# Patient Record
Sex: Male | Born: 1985 | Race: White | Hispanic: No | Marital: Married | Smoking: Never smoker
Health system: Southern US, Community
[De-identification: ages and names within clinical notes are randomized; demographics above are authoritative.]

---

## 2004-04-13 ENCOUNTER — Inpatient Hospital Stay (HOSPITAL_COMMUNITY)
Admission: AD | Admit: 2004-04-13 | Discharge: 2004-04-17 | Payer: Self-pay | Admitting: Physical Medicine & Rehabilitation

## 2004-04-13 ENCOUNTER — Ambulatory Visit: Payer: Self-pay | Admitting: Physical Medicine & Rehabilitation

## 2004-05-06 ENCOUNTER — Encounter (HOSPITAL_COMMUNITY): Admission: RE | Admit: 2004-05-06 | Discharge: 2004-06-05 | Payer: Self-pay

## 2004-06-08 ENCOUNTER — Encounter (HOSPITAL_COMMUNITY): Admission: RE | Admit: 2004-06-08 | Discharge: 2004-07-08 | Payer: Self-pay | Admitting: Orthopedic Surgery

## 2004-07-10 ENCOUNTER — Encounter (HOSPITAL_COMMUNITY): Admission: RE | Admit: 2004-07-10 | Discharge: 2004-08-09 | Payer: Self-pay | Admitting: Orthopedic Surgery

## 2004-08-10 ENCOUNTER — Encounter (HOSPITAL_COMMUNITY): Admission: RE | Admit: 2004-08-10 | Discharge: 2004-09-09 | Payer: Self-pay | Admitting: Orthopedic Surgery

## 2004-09-14 ENCOUNTER — Encounter (HOSPITAL_COMMUNITY): Admission: RE | Admit: 2004-09-14 | Discharge: 2004-10-14 | Payer: Self-pay | Admitting: Orthopedic Surgery

## 2004-10-16 ENCOUNTER — Encounter (HOSPITAL_COMMUNITY): Admission: RE | Admit: 2004-10-16 | Discharge: 2004-10-31 | Payer: Self-pay | Admitting: Orthopedic Surgery

## 2004-11-02 ENCOUNTER — Encounter (HOSPITAL_COMMUNITY): Admission: RE | Admit: 2004-11-02 | Discharge: 2004-12-02 | Payer: Self-pay | Admitting: Orthopedic Surgery

## 2004-12-04 ENCOUNTER — Encounter (HOSPITAL_COMMUNITY): Admission: RE | Admit: 2004-12-04 | Discharge: 2005-01-03 | Payer: Self-pay | Admitting: Orthopedic Surgery

## 2005-01-04 ENCOUNTER — Encounter (HOSPITAL_COMMUNITY): Admission: RE | Admit: 2005-01-04 | Discharge: 2005-01-15 | Payer: Self-pay | Admitting: Orthopedic Surgery

## 2007-04-25 ENCOUNTER — Ambulatory Visit (HOSPITAL_COMMUNITY): Admission: RE | Admit: 2007-04-25 | Discharge: 2007-04-26 | Payer: Self-pay | Admitting: Family Medicine

## 2007-04-28 ENCOUNTER — Ambulatory Visit: Payer: Self-pay | Admitting: Cardiology
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
## 2014-04-17 ENCOUNTER — Telehealth: Payer: Self-pay | Admitting: Family Medicine
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
## 2014-04-18 NOTE — Telephone Encounter (Signed)
Christian Ramirez I am not currently taking new pts unless a family member of a current see next  ( is this an immediate member of a family we are seeing? Dad of a child? Etc ) Dr Margo AyeHall [place] [address] or [place] other choices

## 2014-04-18 NOTE — Telephone Encounter (Signed)
This will be a new patient

## 2014-04-22 NOTE — Telephone Encounter (Signed)
We would be happy to fill out this form. Please have him drop it by. Also at his age we do recommend a general health checkup at least every 2-3 years so it sounds to me like he is probably due for this spring

## 2014-04-22 NOTE — Telephone Encounter (Signed)
pts spouse is a patient here, Vonzell Schlatterawn Garfinkel

## 2014-04-23 NOTE — Telephone Encounter (Signed)
Pt notified on VM. 

## 2014-04-25 ENCOUNTER — Telehealth: Payer: Self-pay | Admitting: Family Medicine

## 2014-04-25 NOTE — Telephone Encounter (Signed)
Pt's wife dropped off a form to be filled out for a handicap placard. Pt's wife Stated that the pt has never been seen before but said that he spoke with a nurse last Week regarding this form and the nurse said the Dr. Eulah CitizenWould still fill the form out with out the Pt being seen. Pt's wife is a pt here.

## 2014-04-28 NOTE — Telephone Encounter (Signed)
Completed. Patient is advised to follow-up at least every 2 years for wellness

## 2014-04-29 ENCOUNTER — Telehealth: Payer: Self-pay | Admitting: *Deleted

## 2014-04-29 NOTE — Telephone Encounter (Signed)
[facility] to notify pt that form is ready for pickup.

## 2014-12-18 ENCOUNTER — Emergency Department (HOSPITAL_COMMUNITY)
Admission: EM | Admit: 2014-12-18 | Discharge: 2014-12-18 | Disposition: A | Discharge status: Home / Self Care | Payer: Managed Care, Other (non HMO) | Attending: Emergency Medicine | Admitting: Emergency Medicine

## 2014-12-18 ENCOUNTER — Emergency Department (HOSPITAL_COMMUNITY): Payer: Managed Care, Other (non HMO)

## 2014-12-18 ENCOUNTER — Encounter (HOSPITAL_COMMUNITY): Payer: Self-pay | Admitting: Emergency Medicine

## 2014-12-18 DIAGNOSIS — Y998 Other external cause status: Secondary | ICD-10-CM | POA: Insufficient documentation

## 2014-12-18 DIAGNOSIS — Y9389 Activity, other specified: Secondary | ICD-10-CM | POA: Insufficient documentation

## 2014-12-18 DIAGNOSIS — R0789 Other chest pain: Secondary | ICD-10-CM

## 2014-12-18 DIAGNOSIS — S29001A Unspecified injury of muscle and tendon of front wall of thorax, initial encounter: Secondary | ICD-10-CM | POA: Insufficient documentation

## 2014-12-18 DIAGNOSIS — S299XXA Unspecified injury of thorax, initial encounter: Secondary | ICD-10-CM | POA: Diagnosis present

## 2014-12-18 DIAGNOSIS — Y92410 Unspecified street and highway as the place of occurrence of the external cause: Secondary | ICD-10-CM | POA: Insufficient documentation

## 2014-12-18 MED ORDER — IBUPROFEN 600 MG PO TABS: 600.0000 mg | ORAL_TABLET | Freq: Three times a day (TID) | ORAL | Status: DC | PRN

## 2014-12-18 MED ORDER — IBUPROFEN 400 MG PO TABS
600.0000 mg | ORAL_TABLET | Freq: Once | ORAL | Status: AC
Start: 2014-12-18 — End: 2014-12-18
  Administered 2014-12-18: 600 mg via ORAL
  Filled 2014-12-18: qty 2
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
## 2014-12-18 NOTE — Discharge Instructions (Signed)

## 2014-12-18 NOTE — ED Notes (Signed)
Pt made aware to return if symptoms worsen or if any life threatening symptoms occur.   
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
## 2014-12-18 NOTE — ED Notes (Signed)
Dr. Campos at bedside   

## 2014-12-20 ENCOUNTER — Telehealth: Payer: Self-pay | Admitting: Family Medicine

## 2014-12-20 ENCOUNTER — Encounter: Payer: Self-pay | Admitting: Family Medicine
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
## 2014-12-20 NOTE — Telephone Encounter (Signed)
Tuesday or Friday am or with Eber Jonesarolyn, work excuse next week

## 2014-12-20 NOTE — Telephone Encounter (Signed)
Appt made for 11/25 and W/E done

## 2014-12-27 ENCOUNTER — Ambulatory Visit (INDEPENDENT_AMBULATORY_CARE_PROVIDER_SITE_OTHER): Payer: Self-pay | Admitting: Family Medicine

## 2014-12-27 ENCOUNTER — Encounter: Payer: Self-pay | Admitting: Family Medicine

## 2014-12-27 VITALS — BP 122/84 | Ht 76.0 in | Wt 163.2 lb

## 2014-12-27 DIAGNOSIS — S20211D Contusion of right front wall of thorax, subsequent encounter: Secondary | ICD-10-CM

## 2014-12-27 MED ORDER — MELOXICAM 15 MG PO TABS: 15.0000 mg | ORAL_TABLET | Freq: Every day | ORAL | Status: DC

## 2014-12-27 MED ORDER — HYDROCODONE-ACETAMINOPHEN 7.5-325 MG PO TABS: 1.0000 | ORAL_TABLET | Freq: Four times a day (QID) | ORAL | Status: DC | PRN
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
## 2015-02-12 ENCOUNTER — Telehealth: Payer: Self-pay | Admitting: Family Medicine
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
## 2015-02-13 NOTE — Telephone Encounter (Signed)
This was approved-form was signed and picked up by Alcario DroughtErica

## 2015-12-29 ENCOUNTER — Encounter: Payer: Self-pay | Admitting: Family Medicine

## 2015-12-29 ENCOUNTER — Ambulatory Visit (INDEPENDENT_AMBULATORY_CARE_PROVIDER_SITE_OTHER): Payer: Managed Care, Other (non HMO) | Admitting: Family Medicine

## 2015-12-29 VITALS — BP 118/78 | Ht 76.0 in | Wt 167.8 lb

## 2015-12-29 DIAGNOSIS — M79605 Pain in left leg: Secondary | ICD-10-CM

## 2015-12-29 DIAGNOSIS — G546 Phantom limb syndrome with pain: Secondary | ICD-10-CM | POA: Insufficient documentation

## 2015-12-29 DIAGNOSIS — H9202 Otalgia, left ear: Secondary | ICD-10-CM

## 2015-12-29 MED ORDER — HYDROCODONE-ACETAMINOPHEN 7.5-325 MG PO TABS: 1.0000 | ORAL_TABLET | Freq: Three times a day (TID) | ORAL | 0 refills | Status: DC | PRN

## 2015-12-29 NOTE — Progress Notes (Signed)
   Subjective:    Patient ID: Christian Ramirez, male    DOB: April 13, 1985, 30 y.o.   MRN: [id number]  HPI Patient states he needs a refill of hydrocodone for occasional phantom pain. Patient states his last prescription was a year ago and has expired and would like to have some on hand that are in date.   Patient also with c/o left ear pain off and on for few weeks-worse in the cold air. Patient relates a low bit of a head cold but that has gone away. Denies wheezing or difficulty breathing  Review of Systems    no fever vomiting diarrhea. Objective:   Physical Exam Lungs clear heart regular patient has artificial limb on the left leg above-the-knee amputation ears are normal       Assessment & Plan:  Otalgia-no sign of infection if ongoing troubles notify us none currently  Phantom pains from previous amputation prescription for hydrocodone given 30 tablets, he states last year this last memo whole year. His use is within the next SPECT and range and not abusive  Wellness exam and screening labs recommended

## 2016-01-16 ENCOUNTER — Ambulatory Visit: Payer: Self-pay | Admitting: Family Medicine
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
## 2017-01-03 ENCOUNTER — Ambulatory Visit: Payer: BC Managed Care – PPO | Admitting: Sports Medicine

## 2017-01-03 ENCOUNTER — Encounter: Payer: Self-pay | Admitting: Sports Medicine

## 2017-01-03 DIAGNOSIS — G546 Phantom limb syndrome with pain: Secondary | ICD-10-CM | POA: Diagnosis not present

## 2017-01-03 DIAGNOSIS — Z89612 Acquired absence of left leg above knee: Secondary | ICD-10-CM

## 2017-01-03 MED ORDER — GABAPENTIN 300 MG PO CAPS: 300.0000 mg | ORAL_CAPSULE | Freq: Three times a day (TID) | ORAL | 3 refills | Status: DC | PRN
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
## 2017-01-03 NOTE — Assessment & Plan Note (Signed)
Currently doing occasional oxycodone for phantom pain, I am going to add some gabapentin for him to try.

## 2017-11-02 ENCOUNTER — Other Ambulatory Visit: Payer: Self-pay | Admitting: Sports Medicine

## 2017-11-02 DIAGNOSIS — G546 Phantom limb syndrome with pain: Secondary | ICD-10-CM

## 2018-01-03 ENCOUNTER — Telehealth: Payer: Self-pay | Admitting: Family Medicine

## 2018-01-03 DIAGNOSIS — Z029 Encounter for administrative examinations, unspecified: Secondary | ICD-10-CM

## 2018-01-03 NOTE — Telephone Encounter (Signed)
Patient came in and dropped off FMLA to be filled out for leave due to birth of daughter. I filled in what I could. I havent done one for a man before not sure of wording for this .Please review and fill in highlighted area.In your box.

## 2018-01-03 NOTE — Telephone Encounter (Signed)
Please see

## 2018-01-04 NOTE — Telephone Encounter (Signed)
Filled in as best as possible If pt needs additional info put on it let me know

## 2018-07-03 ENCOUNTER — Ambulatory Visit: Payer: Managed Care, Other (non HMO) | Admitting: Family Medicine

## 2018-07-05 ENCOUNTER — Other Ambulatory Visit: Payer: Self-pay

## 2018-07-05 ENCOUNTER — Ambulatory Visit (INDEPENDENT_AMBULATORY_CARE_PROVIDER_SITE_OTHER): Payer: BC Managed Care – PPO | Admitting: Family Medicine

## 2018-07-05 DIAGNOSIS — R61 Generalized hyperhidrosis: Secondary | ICD-10-CM

## 2018-07-05 MED ORDER — ALUMINUM CHLORIDE 20 % EX SOLN: Freq: Every day | CUTANEOUS | 5 refills | Status: DC

## 2018-07-05 NOTE — Progress Notes (Signed)
   Subjective:    Patient ID: Christian Ramirez, male    DOB: 13-May-1985, 33 y.o.   MRN: [id number]  HPIwould like to discuss a med to help with excessive sweating. If its above 70 degrees he will start sweating. States this has always been an issue.  Significant swelling issues all over but primarily under the arms on the palms and on his prosthesis area occurs with minimal activity would like to try some measures to get this under better control has never had any other issues before never tried any prescription medicines Virtual Visit via Telephone Note  I connected with Christian Ramirez on 07/05/18 at 10:00 AM EDT by telephone and verified that I am speaking with the correct person using two identifiers.  Location: Patient: home Provider: office   I discussed the limitations, risks, security and privacy concerns of performing an evaluation and management service by telephone and the availability of in person appointments. I also discussed with the patient that there may be a patient responsible charge related to this service. The patient expressed understanding and agreed to proceed.   History of Present Illness:    Observations/Objective:   Assessment and Plan:   Follow Up Instructions:    I discussed the assessment and treatment plan with the patient. The patient was provided an opportunity to ask questions and all were answered. The patient agreed with the plan and demonstrated an understanding of the instructions.   The patient was advised to call back or seek an in-person evaluation if the symptoms worsen or if the condition fails to improve as anticipated.  I provided 15 minutes of non-face-to-face time during this encounter.      Review of Systems  Constitutional: Negative for activity change, fatigue and fever.  HENT: Negative for congestion and rhinorrhea.   Respiratory: Negative for cough and shortness of breath.   Cardiovascular: Negative for chest pain and leg  swelling.  Gastrointestinal: Negative for abdominal pain, diarrhea and nausea.  Genitourinary: Negative for dysuria and hematuria.  Neurological: Negative for weakness and headaches.  Psychiatric/Behavioral: Negative for agitation and behavioral problems.       Objective:   Physical Exam    Today's visit was via telephone Physical exam was not possible for this visit     Assessment & Plan:  Hyperhidrosis Certainly there are some different options that can be done to help this We discussed a couple different topical treatments including glycopyrronium as well as aluminum chloride 20% As for oral medications including oral glycol Pyridium as well as oxybutynin I would recommend holding off on these because of side effects therefore we will do that topical aluminum chloride applied nightly for the first 7 days then every 3 to 7 days depending on success after that  Give [place] feedback within a month how this is going

## 2018-07-26 ENCOUNTER — Other Ambulatory Visit: Payer: Self-pay | Admitting: Sports Medicine

## 2018-07-26 DIAGNOSIS — G546 Phantom limb syndrome with pain: Secondary | ICD-10-CM

## 2018-11-13 ENCOUNTER — Telehealth: Payer: Self-pay | Admitting: Family Medicine

## 2018-11-13 NOTE — Telephone Encounter (Signed)
Needs an appointment or he needs to contact his pcp for this medication.

## 2018-11-13 NOTE — Telephone Encounter (Signed)
I am happy to continue the gabapentin but I need to see him at least once a year, the other option is to see if his PCP Dr. Sallee Lange would be comfortable prescribing the gabapentin for him.

## 2018-11-13 NOTE — Telephone Encounter (Signed)
Called patient to let him know to check with PCP to prescribe Gabapentin, and if he has any problems to call us back and set up an in office or virtual follow up with T.

## 2018-11-13 NOTE — Telephone Encounter (Signed)
Patient called, stating that the pharmacy denied his refill of gabapentin (NEURONTIN) 300 MG capsule [[id number]]. Please Advise.

## 2018-11-14 ENCOUNTER — Telehealth: Payer: Self-pay | Admitting: Family Medicine

## 2018-11-14 DIAGNOSIS — G546 Phantom limb syndrome with pain: Secondary | ICD-10-CM

## 2018-11-14 MED ORDER — GABAPENTIN 300 MG PO CAPS: ORAL_CAPSULE | ORAL | 2 refills | Status: DC

## 2018-11-14 NOTE — Telephone Encounter (Signed)
Pt's sports med doc recommended Dr. Nicki Reaper take over ordering pt's gabapentin (NEURONTIN) 300 MG capsule  Pt states that Dr. Dianah Field would need to do a virtual visit before ordering further refills.  Pt states it's ordered for TID but he only takes it once in the morning to help with his phantom pain  Please advise & call pt (pt aware that Dr. Nicki Reaper is not in the office today)   [facility]

## 2018-11-14 NOTE — Telephone Encounter (Signed)
May have the gabapentin ordered 1 every morning 300 mg as currently being taken #30 with 2 refills I do recommend a follow-up visit either in person or virtual later this year regarding this medication Essentially the refills will cover him through the end of the year

## 2018-11-14 NOTE — Telephone Encounter (Signed)
Medication sent in and pt is aware. Pt transferred up front to schedule follow up appt later this year. Pt verbalized understanding

## 2018-11-14 NOTE — Telephone Encounter (Signed)
Please advise. Thank you

## 2018-11-15 ENCOUNTER — Ambulatory Visit: Payer: BC Managed Care – PPO | Admitting: Sports Medicine

## 2019-01-22 ENCOUNTER — Ambulatory Visit (INDEPENDENT_AMBULATORY_CARE_PROVIDER_SITE_OTHER): Payer: BC Managed Care – PPO | Admitting: Family Medicine

## 2019-01-22 ENCOUNTER — Other Ambulatory Visit: Payer: Self-pay

## 2019-01-22 DIAGNOSIS — F439 Reaction to severe stress, unspecified: Secondary | ICD-10-CM

## 2019-01-22 DIAGNOSIS — G546 Phantom limb syndrome with pain: Secondary | ICD-10-CM

## 2019-01-22 MED ORDER — GABAPENTIN 300 MG PO CAPS: ORAL_CAPSULE | ORAL | 5 refills | Status: DC

## 2019-01-22 NOTE — Progress Notes (Signed)
   Subjective:    Patient ID: Christian Ramirez, male    DOB: September 10, 1985, 33 y.o.   MRN: [id number]  HPI Pt here for follow up on Gabapentin. Pt is prescribed Gabapentin 300 mg one capsule po every morning. Pt states he has had to increase that to 2-3 per day due to weather changes. Pt states weather changes trigger his phantom pain. States that time the pain gets worse other times not as bad depending on barometric pressure as well as temperatures.  Tolerates medicine well states 3 days is the maximum he does most days 2 a day denies any other trouble I did encourage him to do a wellness visit 2021 Virtual Visit via Telephone Note  I connected with Christian Ramirez on 01/22/19 at 11:00 AM EST by telephone and verified that I am speaking with the correct person using two identifiers.  Location: Patient: home Provider: office   I discussed the limitations, risks, security and privacy concerns of performing an evaluation and management service by telephone and the availability of in person appointments. I also discussed with the patient that there may be a patient responsible charge related to this service. The patient expressed understanding and agreed to proceed.   History of Present Illness:    Observations/Objective:   Assessment and Plan:   Follow Up Instructions:    I discussed the assessment and treatment plan with the patient. The patient was provided an opportunity to ask questions and all were answered. The patient agreed with the plan and demonstrated an understanding of the instructions.   The patient was advised to call back or seek an in-person evaluation if the symptoms worsen or if the condition fails to improve as anticipated.  I provided 16 minutes of non-face-to-face time during this encounter.   Vicente Males, LPN    Review of Systems  Constitutional: Negative for diaphoresis and fatigue.  HENT: Negative for congestion and rhinorrhea.   Respiratory: Negative  for cough and shortness of breath.   Cardiovascular: Negative for chest pain and leg swelling.  Gastrointestinal: Negative for abdominal pain and diarrhea.  Skin: Negative for color change and rash.  Neurological: Negative for dizziness and headaches.  Psychiatric/Behavioral: Negative for behavioral problems and confusion.       Objective:   Physical Exam  Today's visit was via telephone Physical exam was not possible for this visit  We did discuss caution drowsiness with gabapentin     Assessment & Plan:  Phantom pain May titrate gabapentin to what ever works best for him currently doing 1 a day but he states 3-day works better but some days 2 a day so therefore we will write it is 1 taken 3 times daily as needed follow-up if progressive troubles

## 2019-06-12 ENCOUNTER — Telehealth: Payer: Self-pay | Admitting: Family Medicine

## 2019-06-12 DIAGNOSIS — Z0000 Encounter for general adult medical examination without abnormal findings: Secondary | ICD-10-CM

## 2019-06-12 DIAGNOSIS — Z114 Encounter for screening for human immunodeficiency virus [HIV]: Secondary | ICD-10-CM

## 2019-06-12 DIAGNOSIS — Z79899 Other long term (current) drug therapy: Secondary | ICD-10-CM

## 2019-06-12 DIAGNOSIS — Z13220 Encounter for screening for lipoid disorders: Secondary | ICD-10-CM

## 2019-06-12 NOTE — Telephone Encounter (Signed)
Pt returned call and verbalized understanding  

## 2019-06-12 NOTE — Telephone Encounter (Signed)
Liver, lipid, met 7 HIV antibody per CDC guideline

## 2019-06-12 NOTE — Telephone Encounter (Signed)
Pt has CPE on 6/29 and would like to have lab work done before appt.

## 2019-06-12 NOTE — Telephone Encounter (Signed)
Lab orders placed. Left message to return call  

## 2019-06-20 ENCOUNTER — Telehealth: Payer: Self-pay | Admitting: Family Medicine

## 2019-06-20 NOTE — Telephone Encounter (Signed)
Nurse portion filled out. Form in provider office. Please advise. Thank you

## 2019-06-20 NOTE — Telephone Encounter (Signed)
Patient is dropping off parking placard renewal form to be completed. I will place in the forms box.

## 2019-06-22 NOTE — Telephone Encounter (Signed)
Form was completed please forward to the patient 

## 2019-07-10 ENCOUNTER — Encounter: Payer: Self-pay | Admitting: Family Medicine

## 2019-07-10 LAB — BASIC METABOLIC PANEL
BUN/Creatinine Ratio: 12 (ref 9–20)
BUN: 12 mg/dL (ref 6–20)
CO2: 23 mmol/L (ref 20–29)
Calcium: 9.6 mg/dL (ref 8.7–10.2)
Chloride: 102 mmol/L (ref 96–106)
Creatinine, Ser: 1.03 mg/dL (ref 0.76–1.27)
GFR calc Af Amer: 108 mL/min/{1.73_m2} (ref >59)
GFR calc non Af Amer: 93 mL/min/{1.73_m2} (ref >59)
Glucose: 95 mg/dL (ref 65–99)
Potassium: 4.5 mmol/L (ref 3.5–5.2)
Sodium: 139 mmol/L (ref 134–144)

## 2019-07-10 LAB — HEPATIC FUNCTION PANEL
ALT: 11 IU/L (ref 0–44)
AST: 14 IU/L (ref 0–40)
Albumin: 4.6 g/dL (ref 4.0–5.0)
Alkaline Phosphatase: 55 IU/L (ref 48–121)
Bilirubin Total: 0.6 mg/dL (ref 0.0–1.2)
Bilirubin, Direct: 0.15 mg/dL (ref 0.00–0.40)
Total Protein: 6.8 g/dL (ref 6.0–8.5)

## 2019-07-10 LAB — LIPID PANEL
Chol/HDL Ratio: 3.2 ratio (ref 0.0–5.0)
Cholesterol, Total: 197 mg/dL (ref 100–199)
HDL: 62 mg/dL (ref >39)
LDL Chol Calc (NIH): 122 mg/dL — ABNORMAL HIGH (ref 0–99)
Triglycerides: 73 mg/dL (ref 0–149)
VLDL Cholesterol Cal: 13 mg/dL (ref 5–40)

## 2019-07-10 LAB — HIV ANTIBODY (ROUTINE TESTING W REFLEX): HIV Screen 4th Generation wRfx: NONREACTIVE

## 2019-07-31 ENCOUNTER — Other Ambulatory Visit: Payer: Self-pay

## 2019-07-31 ENCOUNTER — Ambulatory Visit: Payer: BC Managed Care – PPO | Admitting: Family Medicine

## 2019-07-31 ENCOUNTER — Encounter: Payer: Self-pay | Admitting: Family Medicine

## 2019-07-31 VITALS — BP 114/76 | Temp 97.4°F | Ht 74.5 in | Wt 163.8 lb

## 2019-07-31 DIAGNOSIS — G546 Phantom limb syndrome with pain: Secondary | ICD-10-CM

## 2019-07-31 DIAGNOSIS — Z0000 Encounter for general adult medical examination without abnormal findings: Secondary | ICD-10-CM

## 2019-07-31 MED ORDER — ALUMINUM CHLORIDE 20 % EX SOLN: Freq: Every day | CUTANEOUS | 5 refills | Status: DC

## 2019-07-31 MED ORDER — GABAPENTIN 300 MG PO CAPS: ORAL_CAPSULE | ORAL | 5 refills | Status: DC

## 2019-07-31 NOTE — Progress Notes (Signed)
Subjective:    Patient ID: Christian Ramirez, male    DOB: 03/16/1985, 34 y.o.   MRN: [id number]  HPI The patient comes in today for a wellness visit.  Patient doing well overall  A review of their health history was completed.  A review of medications was also completed.  Any needed refills; gabapentin and drysol We did review his lab work.  LDL slightly up.  HDL very good.  Watch diet.  Stay active. Eating habits: health conscious  Falls/  MVA accidents in past few months: none  Regular exercise: active job  Specialist pt sees on regular basis: none  Preventative health issues were discussed.   Additional concerns: none    Review of Systems  Constitutional: Negative for activity change, appetite change and fever.  HENT: Negative for congestion and rhinorrhea.   Eyes: Negative for discharge.  Respiratory: Negative for cough and wheezing.   Cardiovascular: Negative for chest pain.  Gastrointestinal: Negative for abdominal pain, blood in stool and vomiting.  Genitourinary: Negative for difficulty urinating and frequency.  Musculoskeletal: Negative for neck pain.  Skin: Negative for rash.  Allergic/Immunologic: Negative for environmental allergies and food allergies.  Neurological: Negative for weakness and headaches.  Psychiatric/Behavioral: Negative for agitation.       Objective:   Physical Exam Constitutional:      Appearance: He is well-developed.  HENT:     Head: Normocephalic and atraumatic.     Right Ear: External ear normal.     Left Ear: External ear normal.     Nose: Nose normal.  Eyes:     Pupils: Pupils are equal, round, and reactive to light.  Neck:     Thyroid: No thyromegaly.  Cardiovascular:     Rate and Rhythm: Normal rate and regular rhythm.     Heart sounds: Normal heart sounds. No murmur heard.   Pulmonary:     Effort: Pulmonary effort is normal. No respiratory distress.     Breath sounds: Normal breath sounds. No wheezing.  Abdominal:       General: Bowel sounds are normal. There is no distension.     Palpations: Abdomen is soft. There is no mass.     Tenderness: There is no abdominal tenderness.  Genitourinary:    Penis: Normal.   Musculoskeletal:        General: Normal range of motion.     Cervical back: Normal range of motion and neck supple.  Lymphadenopathy:     Cervical: No cervical adenopathy.  Skin:    General: Skin is warm and dry.     Findings: No erythema.  Neurological:     Mental Status: He is alert.     Motor: No abnormal muscle tone.  Psychiatric:        Behavior: Behavior normal.        Judgment: Judgment normal.     Patient try to do good job watching diet staying physically active.  He is getting a new prosthesis he works full-time with [employer] as a Merchandiser, retail with maintenance      Assessment & Plan:  Adult wellness-complete.wellness physical was conducted today. Importance of diet and exercise were discussed in detail.  In addition to this a discussion regarding safety was also covered. We also reviewed over immunizations and gave recommendations regarding current immunization needed for age.  In addition to this additional areas were also touched on including: Preventative health exams needed:  Colonoscopy not indicated Healthy eating regular physical  activity discussed in detail Patient was advised yearly wellness exam

## 2019-07-31 NOTE — Patient Instructions (Signed)
Results for orders placed or performed in visit on 06/12/19  Hepatic function panel  Result Value Ref Range   Total Protein 6.8 6.0 - 8.5 g/dL   Albumin 4.6 4.0 - 5.0 g/dL   Bilirubin Total 0.6 0.0 - 1.2 mg/dL   Bilirubin, Direct 2.77 0.00 - 0.40 mg/dL   Alkaline Phosphatase 55 48 - 121 IU/L   AST 14 0 - 40 IU/L   ALT 11 0 - 44 IU/L  Lipid Profile  Result Value Ref Range   Cholesterol, Total 197 100 - 199 mg/dL   Triglycerides 73 0 - 149 mg/dL   HDL 62 >41 mg/dL   VLDL Cholesterol Cal 13 5 - 40 mg/dL   LDL Chol Calc (NIH) 287 (H) 0 - 99 mg/dL   Chol/HDL Ratio 3.2 0.0 - 5.0 ratio  Basic Metabolic Panel (BMET)  Result Value Ref Range   Glucose 95 65 - 99 mg/dL   BUN 12 6 - 20 mg/dL   Creatinine, Ser 8.67 0.76 - 1.27 mg/dL   GFR calc non Af Amer 93 >59 mL/min/1.73   GFR calc Af Amer 108 >59 mL/min/1.73   BUN/Creatinine Ratio 12 9 - 20   Sodium 139 134 - 144 mmol/L   Potassium 4.5 3.5 - 5.2 mmol/L   Chloride 102 96 - 106 mmol/L   CO2 23 20 - 29 mmol/L   Calcium 9.6 8.7 - 10.2 mg/dL  HIV antibody (with reflex)  Result Value Ref Range   HIV Screen 4th Generation wRfx Non Reactive Non Reactive

## 2020-02-14 ENCOUNTER — Other Ambulatory Visit: Payer: Self-pay | Admitting: Family Medicine

## 2020-02-14 DIAGNOSIS — G546 Phantom limb syndrome with pain: Secondary | ICD-10-CM

## 2020-08-25 ENCOUNTER — Other Ambulatory Visit: Payer: Self-pay | Admitting: Family Medicine

## 2020-08-25 DIAGNOSIS — G546 Phantom limb syndrome with pain: Secondary | ICD-10-CM

## 2020-09-19 ENCOUNTER — Telehealth: Payer: Self-pay | Admitting: Family Medicine

## 2020-09-19 ENCOUNTER — Other Ambulatory Visit: Payer: Self-pay

## 2020-09-19 ENCOUNTER — Encounter: Payer: Self-pay | Admitting: Family Medicine

## 2020-09-19 ENCOUNTER — Ambulatory Visit: Payer: BC Managed Care – PPO | Admitting: Family Medicine

## 2020-09-19 ENCOUNTER — Other Ambulatory Visit: Payer: Self-pay | Admitting: *Deleted

## 2020-09-19 DIAGNOSIS — G546 Phantom limb syndrome with pain: Secondary | ICD-10-CM | POA: Diagnosis not present

## 2020-09-19 DIAGNOSIS — Z13220 Encounter for screening for lipoid disorders: Secondary | ICD-10-CM

## 2020-09-19 DIAGNOSIS — Z79899 Other long term (current) drug therapy: Secondary | ICD-10-CM

## 2020-09-19 MED ORDER — GABAPENTIN 300 MG PO CAPS: ORAL_CAPSULE | ORAL | 3 refills | Status: DC

## 2020-09-19 NOTE — Telephone Encounter (Signed)
Blood work was ordered in Academic librarian at office visit today. Patient is aware.

## 2020-09-19 NOTE — Progress Notes (Signed)
   Subjective:    Patient ID: Christian Ramirez, male    DOB: [birth year], 35 y.o.   MRN: [id number]  HPI  Patient arrives for a follow up on phantom leg pain and Neurontin. Patient states he is doing well on current med. 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  He would like to continue this Most days he takes 2/day sometimes 3/day occasionally 1 in the middle of the night but 90/month lasts him the whole month 1 year refill sent in  Wellness visit later this year lab work before that visit

## 2020-09-19 NOTE — Patient Instructions (Signed)
https://www.nhlbi.nih.gov/files/docs/public/heart/dash_brief.pdf">  DASH Eating Plan DASH stands for Dietary Approaches to Stop Hypertension. The DASH eating plan is a healthy eating plan that has been shown to: Reduce high blood pressure (hypertension). Reduce your risk for type 2 diabetes, heart disease, and stroke. Help with weight loss. What are tips for following this plan? Reading food labels Check food labels for the amount of salt (sodium) per serving. Choose foods with less than 5 percent of the Daily Value of sodium. Generally, foods with less than 300 milligrams (mg) of sodium per serving fit into this eating plan. To find whole grains, look for the word "whole" as the first word in the ingredient list. Shopping Buy products labeled as "low-sodium" or "no salt added." Buy fresh foods. Avoid canned foods and pre-made or frozen meals. Cooking Avoid adding salt when cooking. Use salt-free seasonings or herbs instead of table salt or sea salt. Check with your health care provider or pharmacist before using salt substitutes. Do not fry foods. Cook foods using healthy methods such as baking, boiling, grilling, roasting, and broiling instead. Cook with heart-healthy oils, such as olive, canola, avocado, soybean, or sunflower oil. Meal planning  Eat a balanced diet that includes: 4 or more servings of fruits and 4 or more servings of vegetables each day. Try to fill one-half of your plate with fruits and vegetables. 6-8 servings of whole grains each day. Less than 6 oz (170 g) of lean meat, poultry, or fish each day. A 3-oz (85-g) serving of meat is about the same size as a deck of cards. One egg equals 1 oz (28 g). 2-3 servings of low-fat dairy each day. One serving is 1 cup (237 mL). 1 serving of nuts, seeds, or beans 5 times each week. 2-3 servings of heart-healthy fats. Healthy fats called omega-3 fatty acids are found in foods such as walnuts, flaxseeds, fortified milks, and eggs.  These fats are also found in cold-water fish, such as sardines, salmon, and mackerel. Limit how much you eat of: Canned or prepackaged foods. Food that is high in trans fat, such as some fried foods. Food that is high in saturated fat, such as fatty meat. Desserts and other sweets, sugary drinks, and other foods with added sugar. Full-fat dairy products. Do not salt foods before eating. Do not eat more than 4 egg yolks a week. Try to eat at least 2 vegetarian meals a week. Eat more home-cooked food and less restaurant, buffet, and fast food.  Lifestyle When eating at a restaurant, ask that your food be prepared with less salt or no salt, if possible. If you drink alcohol: Limit how much you use to: 0-1 drink a day for women who are not pregnant. 0-2 drinks a day for men. Be aware of how much alcohol is in your drink. In the [place] one drink equals one 12 oz bottle of beer (355 mL), one 5 oz glass of wine (148 mL), or one 1 oz glass of hard liquor (44 mL). General information Avoid eating more than 2,300 mg of salt a day. If you have hypertension, you may need to reduce your sodium intake to 1,500 mg a day. Work with your health care provider to maintain a healthy body weight or to lose weight. Ask what an ideal weight is for you. Get at least 30 minutes of exercise that causes your heart to beat faster (aerobic exercise) most days of the week. Activities may include walking, swimming, or biking. Work with your health care provider   or dietitian to adjust your eating plan to your individual calorie needs. What foods should I eat? Fruits All fresh, dried, or frozen fruit. Canned fruit in natural juice (without addedsugar). Vegetables Fresh or frozen vegetables (raw, steamed, roasted, or grilled). Low-sodium or reduced-sodium tomato and vegetable juice. Low-sodium or reduced-sodium tomatosauce and tomato paste. Low-sodium or reduced-sodium canned vegetables. Grains Whole-grain or  whole-wheat bread. Whole-grain or whole-wheat pasta. Brown rice. Oatmeal. Quinoa. Bulgur. Whole-grain and low-sodium cereals. Pita bread.Low-fat, low-sodium crackers. Whole-wheat flour tortillas. Meats and other proteins Skinless chicken or turkey. Ground chicken or turkey. Pork with fat trimmed off. Fish and seafood. Egg whites. Dried beans, peas, or lentils. Unsalted nuts, nut butters, and seeds. Unsalted canned beans. Lean cuts of beef with fat trimmed off. Low-sodium, lean precooked or cured meat, such as sausages or meatloaves. Dairy Low-fat (1%) or fat-free (skim) milk. Reduced-fat, low-fat, or fat-free cheeses. Nonfat, low-sodium ricotta or cottage cheese. Low-fat or nonfatyogurt. Low-fat, low-sodium cheese. Fats and oils Soft margarine without trans fats. Vegetable oil. Reduced-fat, low-fat, or light mayonnaise and salad dressings (reduced-sodium). Canola, safflower, olive, avocado, soybean, andsunflower oils. Avocado. Seasonings and condiments Herbs. Spices. Seasoning mixes without salt. Other foods Unsalted popcorn and pretzels. Fat-free sweets. The items listed above may not be a complete list of foods and beverages you can eat. Contact a dietitian for more information. What foods should I avoid? Fruits Canned fruit in a light or heavy syrup. Fried fruit. Fruit in cream or buttersauce. Vegetables Creamed or fried vegetables. Vegetables in a cheese sauce. Regular canned vegetables (not low-sodium or reduced-sodium). Regular canned tomato sauce and paste (not low-sodium or reduced-sodium). Regular tomato and vegetable juice(not low-sodium or reduced-sodium). Pickles. Olives. Grains Baked goods made with fat, such as croissants, muffins, or some breads. Drypasta or rice meal packs. Meats and other proteins Fatty cuts of meat. Ribs. Fried meat. Bacon. Bologna, salami, and other precooked or cured meats, such as sausages or meat loaves. Fat from the back of a pig (fatback). Bratwurst.  Salted nuts and seeds. Canned beans with added salt. Canned orsmoked fish. Whole eggs or egg yolks. Chicken or turkey with skin. Dairy Whole or 2% milk, cream, and half-and-half. Whole or full-fat cream cheese. Whole-fat or sweetened yogurt. Full-fat cheese. Nondairy creamers. Whippedtoppings. Processed cheese and cheese spreads. Fats and oils Butter. Stick margarine. Lard. Shortening. Ghee. Bacon fat. Tropical oils, suchas coconut, palm kernel, or palm oil. Seasonings and condiments Onion salt, garlic salt, seasoned salt, table salt, and sea salt. Worcestershire sauce. Tartar sauce. Barbecue sauce. Teriyaki sauce. Soy sauce, including reduced-sodium. Steak sauce. Canned and packaged gravies. Fish sauce. Oyster sauce. Cocktail sauce. Store-bought horseradish. Ketchup. Mustard. Meat flavorings and tenderizers. Bouillon cubes. Hot sauces. Pre-made or packaged marinades. Pre-made or packaged taco seasonings. Relishes. Regular saladdressings. Other foods Salted popcorn and pretzels. The items listed above may not be a complete list of foods and beverages you should avoid. Contact a dietitian for more information. Where to find more information National Heart, Lung, and Blood Institute: www.nhlbi.nih.gov American Heart Association: www.heart.org Academy of Nutrition and Dietetics: www.eatright.org National Kidney Foundation: www.kidney.org Summary The DASH eating plan is a healthy eating plan that has been shown to reduce high blood pressure (hypertension). It may also reduce your risk for type 2 diabetes, heart disease, and stroke. When on the DASH eating plan, aim to eat more fresh fruits and vegetables, whole grains, lean proteins, low-fat dairy, and heart-healthy fats. With the DASH eating plan, you should limit salt (sodium) intake to 2,300   mg a day. If you have hypertension, you may need to reduce your sodium intake to 1,500 mg a day. Work with your health care provider or dietitian to adjust  your eating plan to your individual calorie needs. This information is not intended to replace advice given to you by your health care provider. Make sure you discuss any questions you have with your healthcare provider. Document Revised: 12/22/2018 Document Reviewed: 12/22/2018 Elsevier Patient Education  2022 Elsevier Inc.  

## 2020-09-19 NOTE — Telephone Encounter (Signed)
Patient has physical on 9/23 and needing labs. 

## 2020-09-24 NOTE — Telephone Encounter (Signed)
Patient had medication follow up on 8/19

## 2020-10-22 LAB — HEPATIC FUNCTION PANEL
ALT: 16 IU/L (ref 0–44)
AST: 13 IU/L (ref 0–40)
Albumin: 4.8 g/dL (ref 4.0–5.0)
Alkaline Phosphatase: 49 IU/L (ref 44–121)
Bilirubin Total: 0.6 mg/dL (ref 0.0–1.2)
Bilirubin, Direct: 0.14 mg/dL (ref 0.00–0.40)
Total Protein: 7 g/dL (ref 6.0–8.5)

## 2020-10-22 LAB — LIPID PANEL
Chol/HDL Ratio: 2.7 ratio (ref 0.0–5.0)
Cholesterol, Total: 185 mg/dL (ref 100–199)
HDL: 69 mg/dL (ref >39)
LDL Chol Calc (NIH): 100 mg/dL — ABNORMAL HIGH (ref 0–99)
Triglycerides: 86 mg/dL (ref 0–149)
VLDL Cholesterol Cal: 16 mg/dL (ref 5–40)

## 2020-10-22 LAB — BASIC METABOLIC PANEL
BUN/Creatinine Ratio: 10 (ref 9–20)
BUN: 11 mg/dL (ref 6–20)
CO2: 28 mmol/L (ref 20–29)
Calcium: 9.7 mg/dL (ref 8.7–10.2)
Chloride: 101 mmol/L (ref 96–106)
Creatinine, Ser: 1.09 mg/dL (ref 0.76–1.27)
Glucose: 88 mg/dL (ref 65–99)
Potassium: 4.3 mmol/L (ref 3.5–5.2)
Sodium: 140 mmol/L (ref 134–144)
eGFR: 90 mL/min/{1.73_m2} (ref >59)

## 2020-10-24 ENCOUNTER — Ambulatory Visit (INDEPENDENT_AMBULATORY_CARE_PROVIDER_SITE_OTHER): Payer: BC Managed Care – PPO | Admitting: Family Medicine

## 2020-10-24 ENCOUNTER — Other Ambulatory Visit: Payer: Self-pay

## 2020-10-24 ENCOUNTER — Encounter: Payer: Self-pay | Admitting: Family Medicine

## 2020-10-24 VITALS — BP 119/84 | HR 98 | Temp 98.2°F | Ht 74.5 in | Wt 162.8 lb

## 2020-10-24 DIAGNOSIS — Z0000 Encounter for general adult medical examination without abnormal findings: Secondary | ICD-10-CM

## 2020-10-24 NOTE — Progress Notes (Signed)
   Subjective:    Patient ID: Christian Ramirez, male    DOB: 21-Oct-1985, 35 y.o.   MRN: [id number]  HPI The patient comes in today for a wellness visit.  Therefore wellness does not smoke does drink 1-2 drinks per day denies chest pain shortness of breath.  Denies any wheezing fevers chills  A review of their health history was completed.  A review of medications was also completed.  Any needed refills; none  Eating habits: healthy eating  Falls/  MVA accidents in past few months: none  Regular exercise: extensive walking at work; some treadmill use  Specialist pt sees on regular basis: none  Preventative health issues were discussed.   Additional concerns:    Results for orders placed or performed in visit on 09/19/20  Lipid panel  Result Value Ref Range   Cholesterol, Total 185 100 - 199 mg/dL   Triglycerides 86 0 - 149 mg/dL   HDL 69 >39 mg/dL   VLDL Cholesterol Cal 16 5 - 40 mg/dL   LDL Chol Calc (NIH) 100 (H) 0 - 99 mg/dL   Chol/HDL Ratio 2.7 0.0 - 5.0 ratio  Hepatic function panel  Result Value Ref Range   Total Protein 7.0 6.0 - 8.5 g/dL   Albumin 4.8 4.0 - 5.0 g/dL   Bilirubin Total 0.6 0.0 - 1.2 mg/dL   Bilirubin, Direct 0.14 0.00 - 0.40 mg/dL   Alkaline Phosphatase 49 44 - 121 IU/L   AST 13 0 - 40 IU/L   ALT 16 0 - 44 IU/L  Basic metabolic panel  Result Value Ref Range   Glucose 88 65 - 99 mg/dL   BUN 11 6 - 20 mg/dL   Creatinine, Ser 1.09 0.76 - 1.27 mg/dL   eGFR 90 >59 mL/min/1.73   BUN/Creatinine Ratio 10 9 - 20   Sodium 140 134 - 144 mmol/L   Potassium 4.3 3.5 - 5.2 mmol/L   Chloride 101 96 - 106 mmol/L   CO2 28 20 - 29 mmol/L   Calcium 9.7 8.7 - 10.2 mg/dL    Review of Systems     Objective:   Physical Exam  General-in no acute distress Eyes-no discharge Lungs-respiratory rate normal, CTA CV-no murmurs,RRR Extremities skin warm dry no edema Neuro grossly normal Behavior normal, alert       Assessment & Plan:  Adult  wellness-complete.wellness physical was conducted today. Importance of diet and exercise were discussed in detail.  In addition to this a discussion regarding safety was also covered. We also reviewed over immunizations and gave recommendations regarding current immunization needed for age.  In addition to this additional areas were also touched on including: Preventative health exams needed:  Colonoscopy not indicated Labs look good including cholesterol Follow-up on a yearly basis  Patient was advised yearly wellness exam

## 2021-10-19 ENCOUNTER — Encounter: Payer: Self-pay | Admitting: Family Medicine

## 2021-10-19 ENCOUNTER — Ambulatory Visit: Payer: BC Managed Care – PPO | Admitting: Family Medicine

## 2021-10-19 DIAGNOSIS — G546 Phantom limb syndrome with pain: Secondary | ICD-10-CM | POA: Diagnosis not present

## 2021-10-19 MED ORDER — ALUMINUM CHLORIDE 20 % EX SOLN: Freq: Every day | CUTANEOUS | 5 refills | Status: DC

## 2021-10-19 MED ORDER — GABAPENTIN 300 MG PO CAPS: ORAL_CAPSULE | ORAL | 3 refills | Status: DC

## 2021-10-19 NOTE — Progress Notes (Signed)
   Subjective:    Patient ID: Christian Ramirez, male    DOB: [birth year], 36 y.o.   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-doing well with the prosthetic still has some phantom pain some days worse than others worse with temperature and low fronts Overall doing well moods are doing good dietary doing well staying active  Review of Systems     Objective:   Physical Exam General-in no acute distress Eyes-no discharge Lungs-respiratory rate normal, CTA CV-no murmurs,RRR Extremities skin warm dry no edema Neuro grossly normal Behavior normal, alert        Assessment & Plan:  Family history of blood pressure his blood pressure looks okay healthy diet recommended regular physical activity  Phantom pain-gabapentin seems to be doing well 2 to 3/day depending on situation follow-up if any ongoing troubles  His last lab work overall looks very good there is no particular need to put him through additional lab work currently next summer wellness visit with lab work

## 2021-11-06 ENCOUNTER — Telehealth: Payer: BC Managed Care – PPO | Admitting: Physician Assistant

## 2021-11-06 ENCOUNTER — Encounter: Payer: Self-pay | Admitting: Family Medicine

## 2021-11-06 DIAGNOSIS — B9689 Other specified bacterial agents as the cause of diseases classified elsewhere: Secondary | ICD-10-CM | POA: Diagnosis not present

## 2021-11-06 DIAGNOSIS — J019 Acute sinusitis, unspecified: Secondary | ICD-10-CM

## 2021-11-06 MED ORDER — AMOXICILLIN-POT CLAVULANATE 875-125 MG PO TABS: 1.0000 | ORAL_TABLET | Freq: Two times a day (BID) | ORAL | 0 refills | Status: DC

## 2021-11-06 NOTE — Progress Notes (Signed)

## 2022-01-17 ENCOUNTER — Telehealth: Payer: BC Managed Care – PPO | Admitting: Family

## 2022-01-17 DIAGNOSIS — J069 Acute upper respiratory infection, unspecified: Secondary | ICD-10-CM | POA: Diagnosis not present

## 2022-01-17 MED ORDER — CETIRIZINE HCL 10 MG PO TABS: 10.0000 mg | ORAL_TABLET | Freq: Every day | ORAL | 1 refills | Status: DC

## 2022-01-17 MED ORDER — FLUTICASONE PROPIONATE 50 MCG/ACT NA SUSP: 2.0000 | Freq: Every day | NASAL | 6 refills | Status: DC

## 2022-01-17 NOTE — Progress Notes (Signed)

## 2022-10-02 ENCOUNTER — Other Ambulatory Visit: Payer: Self-pay | Admitting: Family

## 2022-10-02 DIAGNOSIS — J069 Acute upper respiratory infection, unspecified: Secondary | ICD-10-CM

## 2022-11-04 ENCOUNTER — Other Ambulatory Visit: Payer: Self-pay | Admitting: Family Medicine

## 2022-11-04 ENCOUNTER — Other Ambulatory Visit: Payer: Self-pay | Admitting: Family

## 2022-11-04 DIAGNOSIS — J069 Acute upper respiratory infection, unspecified: Secondary | ICD-10-CM

## 2022-12-11 ENCOUNTER — Other Ambulatory Visit: Payer: Self-pay | Admitting: Family Medicine

## 2022-12-11 DIAGNOSIS — J069 Acute upper respiratory infection, unspecified: Secondary | ICD-10-CM

## 2023-06-30 ENCOUNTER — Ambulatory Visit: Payer: Self-pay | Admitting: Family Medicine

## 2023-06-30 VITALS — BP 116/82 | HR 86 | Temp 97.9°F | Ht 74.5 in | Wt 177.8 lb

## 2023-06-30 DIAGNOSIS — G546 Phantom limb syndrome with pain: Secondary | ICD-10-CM | POA: Diagnosis not present

## 2023-06-30 DIAGNOSIS — J301 Allergic rhinitis due to pollen: Secondary | ICD-10-CM

## 2023-06-30 MED ORDER — ALUMINUM CHLORIDE 20 % EX SOLN: Freq: Every day | CUTANEOUS | 5 refills | Status: AC

## 2023-06-30 MED ORDER — FLUTICASONE PROPIONATE 50 MCG/ACT NA SUSP: 2.0000 | Freq: Every day | NASAL | 12 refills | Status: AC

## 2023-06-30 MED ORDER — GABAPENTIN 300 MG PO CAPS: ORAL_CAPSULE | ORAL | 3 refills | Status: AC

## 2023-06-30 NOTE — Progress Notes (Signed)
   Subjective:    Patient ID: Christian Ramirez, male    DOB: 1985/06/15, 38 y.o.   MRN: [id number]  HPI Pt comes in today for medication check. No questions or concerns at this time Medications and allergies reviewed Discussed the use of AI scribe software for clinical note transcription with the patient, who gave verbal consent to proceed.  History of Present Illness   Christian Ramirez is a 38 year old male who presents for a routine follow-up visit.  He feels relatively energetic, estimating his energy level at about 80%. Occasionally, he experiences days where he lacks motivation, particularly when he has no obligations. No issues with breathing during physical activity, and no chest pressure or tightness.  He experiences intermittent phantom limb pain in his leg, exacerbated by weather changes. He manages these symptoms with gabapentin , taking one to two pills per day as needed, and Aleve when he first feels pain. Gabapentin  does not cause drowsiness.  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 For allergies, he uses an off-brand of Flonase  and takes cetirizine  over the counter, finding it more cost-effective than prescriptions.  He discusses his daughter, Christian Ramirez, and the stress associated with parenting. She is active, enjoys riding her bike, and plays soccer. He is intentional about parenting, ensuring Christian Ramirez learns important life skills and behaves well.  He has a family history of skin cancer, with his father having been affected due to sun exposure from working on the railroad. He is mindful of sun exposure, using sunscreen and protective clothing.       Review of Systems     Objective:   Physical Exam General-in no acute distress Eyes-no discharge Lungs-respiratory rate normal, CTA CV-no murmurs,RRR Extremities skin warm dry no edema Neuro grossly normal Behavior normal, alert        Assessment & Plan:  Assessment and Plan     Phantom limb pain Intermittent phantom limb pain managed with gabapentin  and naproxen. Gabapentin  generally well-tolerated, may cause fuzziness if restarted after a break. - Rewrite gabapentin  prescription to twice daily as needed. - Continue naproxen as needed.  Allergic rhinitis Managed with cetirizine  and fluticasone  nasal spray. Insurance changes not significantly affecting costs. - Continue over-the-counter cetirizine  as needed. - Send refills for fluticasone  nasal spray.  General Health Maintenance Preventative measures discussed, including colonoscopy, prostate checks, and cholesterol monitoring. Tetanus booster advised every ten years. - Order cholesterol blood work within 30 days. - Advise tetanus booster every ten years, available at pharmacy.      Currently right now he is only using gabapentin  once or twice a day therefore we will adjust the prescription to be a smaller amount Recommend yearly visit Recommend yearly wellness Lab work ordered No family history of prostate cancer or colon cancer  May use Flonase  and cetirizine  as needed for allergies
